# Patient Record
Sex: Female | Born: 1960
Health system: Southern US, Community
[De-identification: ages and names within clinical notes are randomized; demographics above are authoritative.]

## PROBLEM LIST (undated history)

## (undated) DIAGNOSIS — R5383 Other fatigue: Secondary | ICD-10-CM

## (undated) DIAGNOSIS — E785 Hyperlipidemia, unspecified: Secondary | ICD-10-CM

## (undated) HISTORY — DX: Hyperlipidemia, unspecified: E78.5

## (undated) HISTORY — DX: Other fatigue: R53.83

---

## 2016-07-26 DIAGNOSIS — H5203 Hypermetropia, bilateral: Secondary | ICD-10-CM | POA: Diagnosis not present

## 2016-08-05 DIAGNOSIS — Z86010 Personal history of colonic polyps: Secondary | ICD-10-CM | POA: Diagnosis not present

## 2016-09-02 DIAGNOSIS — J069 Acute upper respiratory infection, unspecified: Secondary | ICD-10-CM | POA: Diagnosis not present

## 2016-09-05 DIAGNOSIS — Z86010 Personal history of colonic polyps: Secondary | ICD-10-CM | POA: Diagnosis not present

## 2016-10-21 DIAGNOSIS — M2242 Chondromalacia patellae, left knee: Secondary | ICD-10-CM | POA: Diagnosis not present

## 2016-10-25 DIAGNOSIS — M2242 Chondromalacia patellae, left knee: Secondary | ICD-10-CM | POA: Diagnosis not present

## 2016-10-25 DIAGNOSIS — M25562 Pain in left knee: Secondary | ICD-10-CM | POA: Diagnosis not present

## 2016-10-27 DIAGNOSIS — M25562 Pain in left knee: Secondary | ICD-10-CM | POA: Diagnosis not present

## 2016-10-27 DIAGNOSIS — M2242 Chondromalacia patellae, left knee: Secondary | ICD-10-CM | POA: Diagnosis not present

## 2016-11-02 DIAGNOSIS — M2242 Chondromalacia patellae, left knee: Secondary | ICD-10-CM | POA: Diagnosis not present

## 2016-11-02 DIAGNOSIS — M25562 Pain in left knee: Secondary | ICD-10-CM | POA: Diagnosis not present

## 2017-01-18 DIAGNOSIS — Z01419 Encounter for gynecological examination (general) (routine) without abnormal findings: Secondary | ICD-10-CM | POA: Diagnosis not present

## 2017-01-18 DIAGNOSIS — Z6828 Body mass index (BMI) 28.0-28.9, adult: Secondary | ICD-10-CM | POA: Diagnosis not present

## 2017-02-13 DIAGNOSIS — D1801 Hemangioma of skin and subcutaneous tissue: Secondary | ICD-10-CM | POA: Diagnosis not present

## 2017-02-13 DIAGNOSIS — L570 Actinic keratosis: Secondary | ICD-10-CM | POA: Diagnosis not present

## 2017-02-13 DIAGNOSIS — D225 Melanocytic nevi of trunk: Secondary | ICD-10-CM | POA: Diagnosis not present

## 2017-02-13 DIAGNOSIS — L813 Cafe au lait spots: Secondary | ICD-10-CM | POA: Diagnosis not present

## 2017-02-13 DIAGNOSIS — L821 Other seborrheic keratosis: Secondary | ICD-10-CM | POA: Diagnosis not present

## 2017-03-13 DIAGNOSIS — N898 Other specified noninflammatory disorders of vagina: Secondary | ICD-10-CM | POA: Diagnosis not present

## 2017-03-13 DIAGNOSIS — Z1159 Encounter for screening for other viral diseases: Secondary | ICD-10-CM | POA: Diagnosis not present

## 2017-04-05 DIAGNOSIS — Z1329 Encounter for screening for other suspected endocrine disorder: Secondary | ICD-10-CM | POA: Diagnosis not present

## 2017-04-05 DIAGNOSIS — Z1231 Encounter for screening mammogram for malignant neoplasm of breast: Secondary | ICD-10-CM | POA: Diagnosis not present

## 2017-04-05 DIAGNOSIS — Z13220 Encounter for screening for lipoid disorders: Secondary | ICD-10-CM | POA: Diagnosis not present

## 2017-04-20 DIAGNOSIS — B009 Herpesviral infection, unspecified: Secondary | ICD-10-CM | POA: Diagnosis not present

## 2017-05-29 DIAGNOSIS — Z23 Encounter for immunization: Secondary | ICD-10-CM | POA: Diagnosis not present

## 2017-05-29 DIAGNOSIS — E7849 Other hyperlipidemia: Secondary | ICD-10-CM | POA: Diagnosis not present

## 2017-05-29 DIAGNOSIS — Z6828 Body mass index (BMI) 28.0-28.9, adult: Secondary | ICD-10-CM | POA: Diagnosis not present

## 2018-01-29 ENCOUNTER — Emergency Department (HOSPITAL_COMMUNITY)
Admission: EM | Admit: 2018-01-29 | Discharge: 2018-01-30 | Disposition: A | Discharge status: Home / Self Care | Payer: BLUE CROSS/BLUE SHIELD | Attending: Emergency Medicine | Admitting: Emergency Medicine

## 2018-01-29 ENCOUNTER — Encounter (HOSPITAL_COMMUNITY): Payer: Self-pay

## 2018-01-29 DIAGNOSIS — Y929 Unspecified place or not applicable: Secondary | ICD-10-CM | POA: Diagnosis not present

## 2018-01-29 DIAGNOSIS — Y999 Unspecified external cause status: Secondary | ICD-10-CM | POA: Diagnosis not present

## 2018-01-29 DIAGNOSIS — S60131A Contusion of right middle finger with damage to nail, initial encounter: Secondary | ICD-10-CM | POA: Insufficient documentation

## 2018-01-29 DIAGNOSIS — W230XXA Caught, crushed, jammed, or pinched between moving objects, initial encounter: Secondary | ICD-10-CM | POA: Diagnosis not present

## 2018-01-29 DIAGNOSIS — S6991XA Unspecified injury of right wrist, hand and finger(s), initial encounter: Secondary | ICD-10-CM | POA: Diagnosis present

## 2018-01-29 DIAGNOSIS — Y9389 Activity, other specified: Secondary | ICD-10-CM | POA: Insufficient documentation

## 2018-01-29 DIAGNOSIS — S61312A Laceration without foreign body of right middle finger with damage to nail, initial encounter: Secondary | ICD-10-CM | POA: Diagnosis not present

## 2018-01-29 DIAGNOSIS — Z23 Encounter for immunization: Secondary | ICD-10-CM | POA: Insufficient documentation

## 2018-01-29 DIAGNOSIS — S61212A Laceration without foreign body of right middle finger without damage to nail, initial encounter: Secondary | ICD-10-CM

## 2018-01-29 DIAGNOSIS — S6010XA Contusion of unspecified finger with damage to nail, initial encounter: Secondary | ICD-10-CM

## 2018-01-29 MED ORDER — LIDOCAINE HCL (PF) 1 % IJ SOLN
30.0000 mL | Freq: Once | INTRAMUSCULAR | Status: DC
  Filled 2018-01-29: qty 30

## 2018-01-29 NOTE — ED Provider Notes (Signed)
[facility]-EMERGENCY DEPT Provider Note   CSN: [id number] Arrival date & time: 01/29/18  2004     History   Chief Complaint Chief Complaint  Patient presents with  . finger laceration    HPI Sara Savage is a 57 y.o. female with a hx of major medical problems presents to the Emergency Department complaining of acute, persistent, right middle finger laceration onset proximal 1 6 PM tonight.  Patient reports she accidentally shut this finger in the car door.  She reports associated throbbing pain and fingernail turning black.  Patient makes the symptoms worse.  No alleviating factors.  She denies numbness, tingling, weakness.  Patient does not take blood thinners.   The history is provided by the patient and medical records. No language interpreter was used.    History reviewed. No pertinent past medical history.  There are no active problems to display for this patient.   History reviewed. No pertinent surgical history.   OB History   None      Home Medications    Prior to Admission medications   Not on File    Family History History reviewed. No pertinent family history.  Social History Social History   Tobacco Use  . Smoking status: Never Smoker  . Smokeless tobacco: Never Used  Substance Use Topics  . Alcohol use: Never    Frequency: Never  . Drug use: Never     Allergies   Patient has no allergy information on record.   Review of Systems Review of Systems  Constitutional: Negative for fever.  Gastrointestinal: Negative for nausea and vomiting.  Skin: Positive for wound.  Allergic/Immunologic: Negative for immunocompromised state.  Neurological: Negative for weakness and numbness.  Hematological: Does not bruise/bleed easily.  Psychiatric/Behavioral: The patient is not nervous/anxious.      Physical Exam Updated Vital Signs BP 130/68 (BP Location: Left Arm)   Pulse (!) 42   Temp 98.4 F (36.9 C) (Oral)   Resp 16    SpO2 100%   Physical Exam  Constitutional: She is oriented to person, place, and time. She appears well-developed and well-nourished. No distress.  HENT:  Head: Normocephalic and atraumatic.  Eyes: Conjunctivae are normal. No scleral icterus.  Neck: Normal range of motion.  Cardiovascular: Normal rate, regular rhythm, normal heart sounds and intact distal pulses.  No murmur heard. Capillary refill < 3 sec  Pulmonary/Chest: Effort normal and breath sounds normal. No respiratory distress.  Musculoskeletal: Normal range of motion. She exhibits no edema.  Full range of motion of the DIP, PIP and MCP of the right middle finger.  Large subungual hematoma underneath the fingernail.  No damage to the nail itself or to the cuticle.  3 cm laceration to the pad of the finger.  Sensation intact throughout the entire finger.  Strength 5/5 with flexion and extension of the right middle finger.  Neurological: She is alert and oriented to person, place, and time.  Skin: Skin is warm and dry. She is not diaphoretic.  Psychiatric: She has a normal mood and affect.  Nursing note and vitals reviewed.    ED Treatments / Results   Radiology Dg Finger Middle Right  Result Date: 01/30/2018 CLINICAL DATA:  Right middle finger pain after injury. Shut finger in car door. Crush injury with distal laceration. EXAM: RIGHT MIDDLE FINGER 2+V COMPARISON:  None. FINDINGS: There is no evidence of fracture or dislocation. There is no evidence of arthropathy or other focal bone abnormality. Soft tissues are unremarkable.  Dressing overlies the distal digit. Small well corticated density adjacent to the second digit middle phalanx appears chronic. IMPRESSION: Negative radiographs of the right middle finger. Electronically Signed   By: Rubye OaksMelanie  Ehinger M.D.   On: 01/30/2018 02:51    Procedures .Marland Kitchen.Laceration Repair Date/Time: 01/30/2018 2:44 AM Performed by: Dierdre ForthMuthersbaugh, Kimberlye Dilger, PA-C Authorized by: Dierdre ForthMuthersbaugh, Kiasia Chou,  PA-C   Consent:    Consent obtained:  Verbal   Consent given by:  Patient   Risks discussed:  Infection and pain   Alternatives discussed:  No treatment Anesthesia (see MAR for exact dosages):    Anesthesia method:  Nerve block   Block anesthetic:  Lidocaine 1% w/o epi (5mL)   Block injection procedure:  Anatomic landmarks identified, anatomic landmarks palpated, introduced needle, negative aspiration for blood and incremental injection   Block outcome:  Anesthesia achieved Laceration details:    Location:  Finger   Finger location:  R long finger   Length (cm):  2.7 Repair type:    Repair type:  Simple Pre-procedure details:    Preparation:  Patient was prepped and draped in usual sterile fashion Exploration:    Hemostasis achieved with:  Direct pressure   Wound exploration: wound explored through full range of motion and entire depth of wound probed and visualized   Treatment:    Area cleansed with:  Saline   Amount of cleaning:  Standard   Irrigation solution:  Sterile water   Irrigation volume:  500   Irrigation method:  Syringe Skin repair:    Repair method:  Sutures   Suture size:  4-0   Suture material:  Fast-absorbing gut   Suture technique:  Simple interrupted   Number of sutures:  6 Approximation:    Approximation:  Close Post-procedure details:    Dressing:  Non-adherent dressing   Patient tolerance of procedure:  Tolerated well, no immediate complications  .Nail Removal Date/Time: 01/30/2018 3:18 AM Performed by: Dierdre ForthMuthersbaugh, Sherlene Rickel, PA-C Authorized by: Dierdre ForthMuthersbaugh, Barbar Brede, PA-C   Consent:    Consent obtained:  Verbal   Consent given by:  Patient   Risks discussed:  Pain and permanent nail deformity   Alternatives discussed:  No treatment Location:    Hand:  R long finger Pre-procedure details:    Skin preparation:  ChloraPrep Anesthesia (see MAR for exact dosages):    Anesthesia method:  Nerve block   Block needle gauge:  25 G   Block  anesthetic:  Lidocaine 1% w/o epi   Block injection procedure:  Anatomic landmarks identified, anatomic landmarks palpated, introduced needle, negative aspiration for blood and incremental injection   Block outcome:  Anesthesia achieved Trephination:    Subungual hematoma drained: yes     Trephination instrument:  Cautery Post-procedure details:    Dressing:  4x4 sterile gauze   Patient tolerance of procedure:  Tolerated well, no immediate complications   (including critical care time)  Medications Ordered in ED Medications  lidocaine (PF) (XYLOCAINE) 1 % injection 30 mL (has no administration in time range)  Tdap (BOOSTRIX) injection 0.5 mL (0.5 mLs Intramuscular Given 01/30/18 0212)     Initial Impression / Assessment and Plan / ED Course  I have reviewed the triage vital signs and the nursing notes.  Pertinent labs & imaging results that were available during my care of the patient were reviewed by me and considered in my medical decision making (see chart for details).     Pressure irrigation performed. Wound explored and base of wound visualized in a bloodless  field without evidence of foreign body.  Laceration occurred < 8 hours prior to repair which was well tolerated.  Subungual hematoma drained.  Tdap updated.  Pt has no comorbidities to effect normal wound healing. Pt discharged without antibiotics.  X-ray without evidence of fracture or dislocation.  I personally evaluated these images.  Discussed suture home care with patient and answered questions. Pt to follow-up for wound check in 7 days; they are to return to the ED sooner for signs of infection. Pt is hemodynamically stable with no complaints prior to dc.   Final Clinical Impressions(s) / ED Diagnoses   Final diagnoses:  Subungual hematoma of digit of hand, initial encounter  Laceration of right middle finger without foreign body without damage to nail, initial encounter    ED Discharge Orders    None         Mardene Sayer Boyd Kerbs 01/30/18 0320    Shon Baton, MD 02/01/18 1710

## 2018-01-30 ENCOUNTER — Emergency Department (HOSPITAL_COMMUNITY): Payer: BLUE CROSS/BLUE SHIELD

## 2018-01-30 DIAGNOSIS — S61212A Laceration without foreign body of right middle finger without damage to nail, initial encounter: Secondary | ICD-10-CM | POA: Diagnosis not present

## 2018-01-30 MED ORDER — TETANUS-DIPHTH-ACELL PERTUSSIS 5-2.5-18.5 LF-MCG/0.5 IM SUSP
0.5000 mL | Freq: Once | INTRAMUSCULAR | Status: AC
  Administered 2018-01-30: 0.5 mL via INTRAMUSCULAR
  Filled 2018-01-30: qty 0.5

## 2018-01-30 NOTE — ED Notes (Signed)
Discharge instructions reviewed with pt. Pt verbalized understanding. Pt to follow up with PCP. PT ambulatory to waiting room.  

## 2018-01-30 NOTE — Discharge Instructions (Addendum)
1. Medications: Tylenol or ibuprofen for pain, usual home medications 2. Treatment: ice for swelling, keep wound clean with warm soap and water and keep bandage dry, do not submerge in water for 24 hours 3. Follow Up: Stitches will come out in 7 days.  Please return to the emergency department sooner if you have concerns. Return to the emergency department for increased redness, drainage of pus from the wound   WOUND CARE  Keep area clean and dry for 24 hours. Do not remove bandage, if applied.  After 24 hours, remove bandage and wash wound gently with mild soap and warm water. Reapply a new bandage after cleaning wound, if directed.   Continue daily cleansing with soap and water until stitches/staples are removed.  Do not apply any ointments or creams to the wound while stitches/staples are in place, as this may cause delayed healing. Return if you experience any of the following signs of infection: Swelling, redness, pus drainage, streaking, fever >101.0 F  Return if you experience excessive bleeding that does not stop after 15-20 minutes of constant, firm pressure.

## 2018-02-15 DIAGNOSIS — L821 Other seborrheic keratosis: Secondary | ICD-10-CM | POA: Diagnosis not present

## 2018-02-15 DIAGNOSIS — D225 Melanocytic nevi of trunk: Secondary | ICD-10-CM | POA: Diagnosis not present

## 2018-02-15 DIAGNOSIS — D1801 Hemangioma of skin and subcutaneous tissue: Secondary | ICD-10-CM | POA: Diagnosis not present

## 2018-02-15 DIAGNOSIS — L814 Other melanin hyperpigmentation: Secondary | ICD-10-CM | POA: Diagnosis not present

## 2018-03-19 DIAGNOSIS — E7849 Other hyperlipidemia: Secondary | ICD-10-CM | POA: Diagnosis not present

## 2018-03-19 DIAGNOSIS — R400 Somnolence: Secondary | ICD-10-CM | POA: Diagnosis not present

## 2018-03-19 DIAGNOSIS — R002 Palpitations: Secondary | ICD-10-CM | POA: Diagnosis not present

## 2018-03-19 DIAGNOSIS — R001 Bradycardia, unspecified: Secondary | ICD-10-CM | POA: Diagnosis not present

## 2018-03-19 DIAGNOSIS — R5383 Other fatigue: Secondary | ICD-10-CM | POA: Diagnosis not present

## 2018-04-06 DIAGNOSIS — Z01419 Encounter for gynecological examination (general) (routine) without abnormal findings: Secondary | ICD-10-CM | POA: Diagnosis not present

## 2018-04-06 DIAGNOSIS — Z6828 Body mass index (BMI) 28.0-28.9, adult: Secondary | ICD-10-CM | POA: Diagnosis not present

## 2018-05-09 ENCOUNTER — Encounter: Payer: Self-pay | Admitting: Neurology

## 2018-05-10 ENCOUNTER — Encounter: Payer: Self-pay | Admitting: Neurology

## 2018-05-10 ENCOUNTER — Ambulatory Visit (INDEPENDENT_AMBULATORY_CARE_PROVIDER_SITE_OTHER): Payer: BLUE CROSS/BLUE SHIELD | Admitting: Neurology

## 2018-05-10 VITALS — BP 118/78 | HR 56 | Ht 65.5 in | Wt 172.0 lb

## 2018-05-10 DIAGNOSIS — R0683 Snoring: Secondary | ICD-10-CM

## 2018-05-10 DIAGNOSIS — R001 Bradycardia, unspecified: Secondary | ICD-10-CM

## 2018-05-10 DIAGNOSIS — R5383 Other fatigue: Secondary | ICD-10-CM

## 2018-05-10 DIAGNOSIS — R002 Palpitations: Secondary | ICD-10-CM | POA: Diagnosis not present

## 2018-05-10 NOTE — Progress Notes (Signed)
SLEEP MEDICINE CLINIC   Provider:  Melvyn Novas, M.D.   Primary Care Physician:  Alysia Penna, MD   Referring Provider: NP Ronney Lion   Chief Complaint  Patient presents with  . New Patient (Initial Visit)    pt alone, rm 11. pt states that there is a heart history in the family. she has been diagnoses with asymptomatic bradycardia. she wakes up with flutters. pt complains of being tired more than usual.     HPI:  Sara Savage is a 57 y.o. female of [place]- [place] ancestry, seen here on 05-10-2018  in a referral from Dr. Link Snuffer / S. Edwards, NP for a sleep evaluation, feeling tired a lot.  She is an athletes heart with asymptomatic bradycardia, is a Armed forces operational officer, Presenter, broadcasting , Master's in physical education and sports science.   Chief complaint according to patient : " I have a reduced energy level" -" trouble to get going in AM", " I am sleeping well but I snore " Several labs were ordered, results not available to me -No restless legs, but a Vit D deficiency was noted. She reduced carb intake.    Sleep habits are as follows: Dinnertime is between 6 and 8 PM , dependent on Tennis match times. She is typically in Bed by 10.30 PM , reading- and no trouble to fall asleep far before midnight. The bedroom is cool, quiet and dark. Her sleep is sometimes interrupted by her retired husband, who is ' tethered " to his electronic devices.  He noted her snoring when in supine, she usually prefers to sleep on her right side. Non adjustable bed, sleeps on one pillow only.   1-2 times nocturia, the first at 3 AM. Goes back to sleep. She is not sure if she dreams at all.  Her alarm is set 7.15, works start at 8.30. She hits snooze several times.   Her sleep may not be as restorative and refreshing it is used to be, but as soon as she is active and on the go she feels ready to face her day there is no excessive daytime sleepiness, she wakes up without any discomfort,  diaphoresis, dizziness, nausea or headaches.  She is woken by palpitations, though !  Sleep medical history and family sleep history:  Father had early cardiac problems with arrhythmia- lived to be 13, dies of CHF (with defibrillator !).  Paternal grandfather died of MI, was a smoker, drinker. Mother died at 60.   Social history: married, Engineer, manufacturing systems - professional. Non smoker, ETOH socially cocktail hour- caffeine - coffee in AM 2 cups. No sodas, no iced tea.;    Review of Systems: Out of a complete 14 system review, the patient complains of only the following symptoms, and all other reviewed systems are negative.  Palpitations. In AM - when she wakes up on her left side - and more often following a late dinner with wine.  Epworth Sleepiness Score- 4/ 24   , Fatigue severity score n/a   , depression score N/A    Social History   Socioeconomic History  . Marital status: Married    Spouse name: Not on file  . Number of children: Not on file  . Years of education: Not on file  . Highest education level: Not on file  Occupational History  . Not on file  Social Needs  . Financial resource strain: Not on file  . Food insecurity:    Worry: Not on file  Inability: Not on file  . Transportation needs:    Medical: Not on file    Non-medical: Not on file  Tobacco Use  . Smoking status: Never Smoker  . Smokeless tobacco: Never Used  Substance and Sexual Activity  . Alcohol use: Yes    Frequency: Never    Comment: socially  . Drug use: Never  . Sexual activity: Not on file  Lifestyle  . Physical activity:    Days per week: Not on file    Minutes per session: Not on file  . Stress: Not on file  Relationships  . Social connections:    Talks on phone: Not on file    Gets together: Not on file    Attends religious service: Not on file    Active member of club or organization: Not on file    Attends meetings of clubs or organizations: Not on file    Relationship status: Not on  file  . Intimate partner violence:    Fear of current or ex partner: Not on file    Emotionally abused: Not on file    Physically abused: Not on file    Forced sexual activity: Not on file  Other Topics Concern  . Not on file  Social History Narrative  . Not on file    Family History  Problem Relation Age of Onset  . Heart attack Father   . Heart failure Father   . Colon cancer Brother   . Cancer Paternal Grandfather     Past Medical History:  Diagnosis Date  . Fatigue   . HLD (hyperlipidemia)     Current Outpatient Medications  Medication Sig Dispense Refill  . rosuvastatin (CRESTOR) 5 MG tablet   5   No current facility-administered medications for this visit.     Allergies as of 05/10/2018  . (Not on File)    Vitals: BP 118/78   Pulse (!) 56   Ht 5' 5.5" (1.664 m)   Wt 172 lb (78 kg)   BMI 28.19 kg/m  Last Weight:  Wt Readings from Last 1 Encounters:  05/10/18 172 lb (78 kg)   GNF:AOZH mass index is 28.19 kg/m.     Last Height:   Ht Readings from Last 1 Encounters:  05/10/18 5' 5.5" (1.664 m)    Physical exam:  General: The patient is awake, alert and appears not in acute distress. The patient is well groomed. Head: Normocephalic, atraumatic. Neck is supple. Mallampati 2-3,  neck circumference:14.5 . Nasal airflow patent ,no TMJ click but she clenches.. Retrognathia is not seen.  Cardiovascular:  Regular rate and rhythm, without  murmurs or carotid bruit, and without distended neck veins. Respiratory: Lungs are clear to auscultation. Skin:  Without evidence of edema, or rash Trunk: BMI is 28.  The patient's posture is erect.   Neurologic exam : The patient is awake and alert, oriented to place and time.   Memory subjective described as intact.   Attention span & concentration ability appears normal.  Speech is fluent,  without dysarthria, dysphonia or aphasia.  Mood and affect are appropriate.  Cranial nerves: Pupils are equal and briskly  reactive to light.  Extraocular movements  in vertical and horizontal planes intact and without nystagmus. Visual fields by finger perimetry are intact.Hearing to finger rub intact.  Facial sensation intact to fine touch. Facial motor strength is symmetric, but the right eye shows a mild ptosis, straddling the upper edge of the pupil ( a family trait) .  and tongue and uvula move midline. Shoulder shrug was symmetrical.  Motor exam:  Normal tone, muscle bulk and symmetric strength in all extremities. Good grip strength.  Sensory:  Fine touch, pinprick and vibration were tested in all extremities. Proprioception tested in the upper extremities was normal.  Coordination: Rapid alternating movements in the fingers/hands was normal. Finger-to-nose maneuver  normal without evidence of ataxia, dysmetria or tremor. Gait and station: Patient walks without assistive device and is able unassisted to climb up to the exam table. Strength within normal limits. Stance is stable and normal. Turns with 3 Steps. Romberg testing is negative.  Deep tendon reflexes: in the  upper and lower extremities are symmetric and intact. Babinski maneuver response is  downgoing.    Assessment:  After physical and neurologic examination, review of laboratory studies,  Personal review of imaging studies, reports of other /same  Imaging studies, results of polysomnography and / or neurophysiology testing and pre-existing records as far as provided in visit., my assessment is   1) Feeling more fatigue - with snoring only, no witnessed apnea, no insomnia, not having frequent nocturia- averaging 7 hours of sleep. She has reported dreamless sleep- a sign that she may have lost REM sleep due to apnea clustering in that sleep stage.   2) Bradycardia is asymptomatic-and I attribute her tendency to have a heart rate of 50 or less at rest to her being an athlete.  3) Morning palpitations have been reported and may be better evaluated by a  cardiac monitor.   4) Ptosis is a strong french canadian trait.    However I will want to rule out that these are related to apnea, hypoxemia or upper airway resistance he.  I will ask for an attended sleep study to be permitted so that I cannot for sure look at REM sleep and its correlation.  Depending on her insurance I may have to order a home sleep test first and if I do not find the desired answers there switch to an attended sleep study next.  My goal is to have the testing completed before the month  ends.    The patient was advised of the nature of the diagnosed disorder , the treatment options and the  risks for general health and wellness arising from not treating the condition.   I spent more than 45 minutes of face to face time with the patient.  Greater than 50% of time was spent in counseling and coordination of care. We have discussed the diagnosis and differential and I answered the patient's questions.    Plan:  Treatment plan and additional workup :  Attended sleep study for evaluation of palpitation, bradycardia and fatigue.   Melvyn NovasARMEN Dante Roudebush, MD 05/10/2018, 8:53 AM  Certified in Neurology by ABPN Certified in Sleep Medicine by [facility]  [facility] [address], [address] [place], [place] [id number]

## 2018-05-17 ENCOUNTER — Other Ambulatory Visit: Payer: Self-pay | Admitting: Neurology

## 2018-05-17 ENCOUNTER — Telehealth: Payer: Self-pay

## 2018-05-17 DIAGNOSIS — R5383 Other fatigue: Secondary | ICD-10-CM

## 2018-05-17 DIAGNOSIS — R002 Palpitations: Secondary | ICD-10-CM

## 2018-05-17 DIAGNOSIS — R001 Bradycardia, unspecified: Secondary | ICD-10-CM

## 2018-05-17 DIAGNOSIS — R0683 Snoring: Secondary | ICD-10-CM

## 2018-05-17 NOTE — Telephone Encounter (Signed)
Insurance has denied in lab sleep study request. Do you want to order HST? 

## 2018-05-17 NOTE — Telephone Encounter (Signed)
Yes

## 2018-05-25 DIAGNOSIS — E7849 Other hyperlipidemia: Secondary | ICD-10-CM | POA: Diagnosis not present

## 2018-05-25 DIAGNOSIS — Z0000 Encounter for general adult medical examination without abnormal findings: Secondary | ICD-10-CM | POA: Diagnosis not present

## 2018-05-30 DIAGNOSIS — R001 Bradycardia, unspecified: Secondary | ICD-10-CM | POA: Diagnosis not present

## 2018-05-30 DIAGNOSIS — R0683 Snoring: Secondary | ICD-10-CM | POA: Diagnosis not present

## 2018-05-30 DIAGNOSIS — Z1389 Encounter for screening for other disorder: Secondary | ICD-10-CM | POA: Diagnosis not present

## 2018-05-30 DIAGNOSIS — Z0000 Encounter for general adult medical examination without abnormal findings: Secondary | ICD-10-CM | POA: Diagnosis not present

## 2018-05-30 DIAGNOSIS — E7849 Other hyperlipidemia: Secondary | ICD-10-CM | POA: Diagnosis not present

## 2018-05-30 DIAGNOSIS — Z23 Encounter for immunization: Secondary | ICD-10-CM | POA: Diagnosis not present

## 2018-06-05 DIAGNOSIS — Z1212 Encounter for screening for malignant neoplasm of rectum: Secondary | ICD-10-CM | POA: Diagnosis not present

## 2018-06-13 ENCOUNTER — Ambulatory Visit (INDEPENDENT_AMBULATORY_CARE_PROVIDER_SITE_OTHER): Payer: BLUE CROSS/BLUE SHIELD | Admitting: Neurology

## 2018-06-13 DIAGNOSIS — R5383 Other fatigue: Secondary | ICD-10-CM

## 2018-06-13 DIAGNOSIS — G4730 Sleep apnea, unspecified: Secondary | ICD-10-CM

## 2018-06-13 DIAGNOSIS — I495 Sick sinus syndrome: Secondary | ICD-10-CM

## 2018-06-13 DIAGNOSIS — R0683 Snoring: Secondary | ICD-10-CM

## 2018-06-13 DIAGNOSIS — G4733 Obstructive sleep apnea (adult) (pediatric): Secondary | ICD-10-CM | POA: Diagnosis not present

## 2018-06-13 DIAGNOSIS — R002 Palpitations: Secondary | ICD-10-CM

## 2018-06-13 DIAGNOSIS — R001 Bradycardia, unspecified: Secondary | ICD-10-CM

## 2018-06-20 NOTE — Addendum Note (Signed)
Addended by: Melvyn Novas on: 06/20/2018 06:14 PM   Modules accepted: Orders

## 2018-06-20 NOTE — Procedures (Signed)
NAME:  Sara Savage                                                                DOB: 06/23/1961 MEDICAL RECORD [id number]                                                DOS:  06/14/2018 REFERRING PHYSICIAN: Alysia Penna, MD STUDY PERFORMED: Home Sleep Study on apnea link HISTORY: HPI:  [name] is a 57 y.o. female and was seen on 05-10-2018 upon referral from Dr. Link Snuffer / S. Edwards, NP, for a sleep evaluation. She reports feeling tired a lot. She has had an athlete's heart with asymptomatic bradycardia, is a Armed forces operational officer, teaching professionally. She obtained a Master's in physical education and sports science.   Chief complaint according to patient:" I have a reduced energy level" -" trouble to get going in AM"," I am sleeping well- but I snore". Reports having 1-2 times nocturia, the first at 3 AM. Goes back to sleep. She is not sure if she dreams at all.  Her alarm is set 7.15, works start at 8.30. She hits snooze several times. Her sleep may not be as restorative and refreshing it is used to be, but as soon as she is active and on the go she feels ready to face her day there is no excessive daytime sleepiness, she wakes up without any discomfort, diaphoresis, dizziness, nausea or headaches.  Epworth Sleepiness Score- 4/ 24 points, BMI: 28.6  STUDY RESULTS:  Total Recording Time: 7 hours 28 minutes, valid test time was 4 h 0 min.  Total Apnea/Hypopnea Index (AHI):  7.5 /h, RDI: 11.2 /h Average Oxygen Saturation:   89 %,   Lowest Oxygen Saturation: 85 %  Total Time in Oxygen Saturation below 89 %: 90 minutes (I believe there must be an artefact)  Average Heart Rate:  56 bpm (between 43 and 125 bpm) - tachy bradycardia.  IMPRESSION: This HST suggested the presence of only mild sleep apnea, but at least moderate snoring. There was an oxygen desaturation index of 6.2/h established. The patient had tachy-brady cardia, remained in regular heart rhythm.  The prolonged hypoxemia remains  unexplained, and may reflect an artefact.  RECOMMENDATION: repeat an attended sleep study-  I certify that I have reviewed the raw data recording prior to the issuance of this report in accordance with the standards of the American Academy of Sleep Medicine (AASM). Melvyn Novas, M.D.   06-20-2018    Medical Director of [facility], accredited by the AASM. Diplomat of the ABPN and ABSM.

## 2018-06-21 ENCOUNTER — Telehealth: Payer: Self-pay | Admitting: *Deleted

## 2018-06-21 NOTE — Telephone Encounter (Signed)
Pt returning RN's call.

## 2018-06-21 NOTE — Telephone Encounter (Signed)
-----   Message from Melvyn Novas, MD sent at 06/20/2018  6:14 PM EDT ----- IMPRESSION: This HST suggested the presence of only mild sleep  apnea, but at least moderate snoring. There was an oxygen  desaturation index of 6.2/h established. The patient had  tachy-brady cardia, remained in regular heart rhythm.  The prolonged hypoxemia remains unexplained, and may reflect an  artefact.  RECOMMENDATION: repeat an attended sleep study- order placed today. 06-20-2018

## 2018-06-21 NOTE — Telephone Encounter (Signed)
LMTC./fim 

## 2018-06-21 NOTE — Telephone Encounter (Signed)
-----   Message from Carmen Dohmeier, MD sent at 06/20/2018  6:14 PM EDT ----- IMPRESSION: This HST suggested the presence of only mild sleep  apnea, but at least moderate snoring. There was an oxygen  desaturation index of 6.2/h established. The patient had  tachy-brady cardia, remained in regular heart rhythm.  The prolonged hypoxemia remains unexplained, and may reflect an  artefact.  RECOMMENDATION: repeat an attended sleep study- order placed today. 06-20-2018 

## 2018-06-21 NOTE — Telephone Encounter (Signed)
LMOM for Sara Savage with below HST results and Dr. Oliva Bustard rec. for an in-lab study.  Please call if she has questions/fim

## 2018-06-28 DIAGNOSIS — I495 Sick sinus syndrome: Secondary | ICD-10-CM | POA: Insufficient documentation

## 2018-06-28 NOTE — Progress Notes (Signed)
Cardiology Office Note:    Date:  06/29/2018   ID:  Sara Savage, DOB [birth year], MRN [id number]  PCP:  Alysia Penna, MD  Cardiologist:  No primary care provider on file.   Referring MD: Alysia Penna, MD   Chief Complaint  Patient presents with  . Advice Only    Bradycardia    History of Present Illness:    Sara Savage is a 57 y.o. female with a hx of palpitations, abnormal sleep study, and fatigue who is referred for consultation by Dr.Carmen Dohmeier / Remi Haggard, NP for bradycardia.  She is referred because of measured bradycardia on her wearable heart monitor device.  Also has history of an EKG at [facility] with heart rate of 42 bpm.  This led to evaluation with a sleep study.  There is questionable sleep apnea.  [name] is very active.  She plays tennis and exercises regularly.  She notices no limitations and no dramatic change in exertional tolerance.  She specifically denies excessive shortness of breath, chest pain, episodes of syncope, and tachycardia/prolonged palpitations.  Father had coronary disease first MI in his late 49s.  Eventual AICD.  He was a smoker.  5 siblings without any known vascular disease.  Hyperlipidemia being treated for primary prevention with low-dose Crestor 5 mg/day and most recent LDL of 111.  Past Medical History:  Diagnosis Date  . Fatigue   . HLD (hyperlipidemia)     History reviewed. No pertinent surgical history.  Current Medications: Current Meds  Medication Sig  . rosuvastatin (CRESTOR) 5 MG tablet      Allergies:   Patient has no allergy information on record.   Social History   Socioeconomic History  . Marital status: Married    Spouse name: Not on file  . Number of children: Not on file  . Years of education: Not on file  . Highest education level: Not on file  Occupational History  . Not on file  Social Needs  . Financial resource strain: Not on file  . Food insecurity:     Worry: Not on file    Inability: Not on file  . Transportation needs:    Medical: Not on file    Non-medical: Not on file  Tobacco Use  . Smoking status: Never Smoker  . Smokeless tobacco: Never Used  Substance and Sexual Activity  . Alcohol use: Yes    Frequency: Never    Comment: socially  . Drug use: Never  . Sexual activity: Not on file  Lifestyle  . Physical activity:    Days per week: Not on file    Minutes per session: Not on file  . Stress: Not on file  Relationships  . Social connections:    Talks on phone: Not on file    Gets together: Not on file    Attends religious service: Not on file    Active member of club or organization: Not on file    Attends meetings of clubs or organizations: Not on file    Relationship status: Not on file  Other Topics Concern  . Not on file  Social History Narrative  . Not on file     Family History: The patient's family history includes Cancer in her paternal grandfather; Colon cancer in her brother; Heart attack in her father; Heart failure in her father.  ROS:   Please see the history of present illness.    Concern about bradycardia but otherwise no complaints.  He does  snore.  She has some fatigue.  Feels better now after starting vitamin D.  All other systems reviewed and are negative.  EKGs/Labs/Other Studies Reviewed:    The following studies were reviewed today: Recent home sleep study was equivocal with reference to the diagnosis of sleep apnea.  EKG:  EKG is  ordered today.  The ekg ordered today demonstrates sinus bradycardia at 57 bpm with left axis deviation/left anterior hemiblock and poor R wave progression V1 through V3.  Recent Labs: No results found for requested labs within last 8760 hours.  Recent Lipid Panel No results found for: CHOL, TRIG, HDL, CHOLHDL, VLDL, LDLCALC, LDLDIRECT  Physical Exam:    VS:  BP 104/72   Pulse (!) 57   Ht 5' 5.5" (1.664 m)   Wt 174 lb 9.6 oz (79.2 kg)   SpO2 96%    BMI 28.61 kg/m     Wt Readings from Last 3 Encounters:  06/29/18 174 lb 9.6 oz (79.2 kg)  05/10/18 172 lb (78 kg)     GEN:  Well nourished, well developed in no acute distress HEENT: Normal NECK: No JVD. LYMPHATICS: No lymphadenopathy CARDIAC: RRR, no murmur, no gallop, no edema. VASCULAR: 2+ symmetric radial, carotids, and posterior tibial pulses.  No bruits. RESPIRATORY:  Clear to auscultation without rales, wheezing or rhonchi  ABDOMEN: Soft, non-tender, non-distended, No pulsatile mass, MUSCULOSKELETAL: No deformity  SKIN: Warm and dry NEUROLOGIC:  Alert and oriented x 3 PSYCHIATRIC:  Normal affect   ASSESSMENT:    1. Bradycardia   2. Hyperlipidemia LDL goal <100   3. Family history of early CAD   4. EKG abnormality    PLAN:    In order of problems listed above:  1. Probably physiologic.  Rule out asymptomatic sinus node dysfunction.  24-hour Holter monitor will be performed.  No action is required at this time. 2. Being treated as primary prevention with low-dose Crestor 5 mg/day.  Most recent LDL since starting therapy was 111.  If she is going to take statin therapy I would recommend increasing intensity to get LDL at least under 100.  This would probably happen if she increases the dose to 10 mg/day. 3. No significant risk factors other than hyperlipidemia.  She is getting greater than 150 minutes of moderate aerobic activity per week.  We discussed glycemic surveillance, blood pressure 130/80 or less, LDL cholesterol less than 098, smoking cessation/abstinence (never a smoker), and management of sleep apnea if present. 4. Left anterior hemiblock of no clinical significance.  Clinical follow-up as needed unless surprises from the Holter monitor.   Medication Adjustments/Labs and Tests Ordered: Current medicines are reviewed at length with the patient today.  Concerns regarding medicines are outlined above.  Orders Placed This Encounter  Procedures  . Holter monitor  - 24 hour  . EKG 12-Lead   No orders of the defined types were placed in this encounter.   Patient Instructions  Medication Instructions:  Your physician recommends that you continue on your current medications as directed. Please refer to the Current Medication list given to you today.  If you need a refill on your cardiac medications before your next appointment, please call your pharmacy.   Lab work: None If you have labs (blood work) drawn today and your tests are completely normal, you will receive your results only by: Marland Kitchen MyChart Message (if you have MyChart) OR . A paper copy in the mail If you have any lab test that is abnormal or we  need to change your treatment, we will call you to review the results.  Testing/Procedures: Your physician has recommended that you wear a 24 hour holter monitor. Holter monitors are medical devices that record the heart's electrical activity. Doctors most often use these monitors to diagnose arrhythmias. Arrhythmias are problems with the speed or rhythm of the heartbeat. The monitor is a small, portable device. You can wear one while you do your normal daily activities. This is usually used to diagnose what is causing palpitations/syncope (passing out).   Follow-Up: Your physician recommends that you schedule a follow-up appointment as needed with Dr. Katrinka Blazing.    Any Other Special Instructions Will Be Listed Below (If Applicable)      Signed, Lesleigh Noe, MD  06/29/2018 9:22 AM    [facility]

## 2018-06-29 ENCOUNTER — Ambulatory Visit (INDEPENDENT_AMBULATORY_CARE_PROVIDER_SITE_OTHER): Payer: BLUE CROSS/BLUE SHIELD | Admitting: Interventional Cardiology

## 2018-06-29 ENCOUNTER — Ambulatory Visit: Payer: BLUE CROSS/BLUE SHIELD | Admitting: Interventional Cardiology

## 2018-06-29 ENCOUNTER — Encounter: Payer: Self-pay | Admitting: Interventional Cardiology

## 2018-06-29 VITALS — BP 104/72 | HR 57 | Ht 65.5 in | Wt 174.6 lb

## 2018-06-29 DIAGNOSIS — I444 Left anterior fascicular block: Secondary | ICD-10-CM

## 2018-06-29 DIAGNOSIS — R9431 Abnormal electrocardiogram [ECG] [EKG]: Secondary | ICD-10-CM

## 2018-06-29 DIAGNOSIS — Z8249 Family history of ischemic heart disease and other diseases of the circulatory system: Secondary | ICD-10-CM | POA: Diagnosis not present

## 2018-06-29 DIAGNOSIS — R001 Bradycardia, unspecified: Secondary | ICD-10-CM | POA: Diagnosis not present

## 2018-06-29 DIAGNOSIS — E785 Hyperlipidemia, unspecified: Secondary | ICD-10-CM | POA: Diagnosis not present

## 2018-06-29 NOTE — Patient Instructions (Signed)
Medication Instructions:  Your physician recommends that you continue on your current medications as directed. Please refer to the Current Medication list given to you today.  If you need a refill on your cardiac medications before your next appointment, please call your pharmacy.   Lab work: None If you have labs (blood work) drawn today and your tests are completely normal, you will receive your results only by: Marland Kitchen MyChart Message (if you have MyChart) OR . A paper copy in the mail If you have any lab test that is abnormal or we need to change your treatment, we will call you to review the results.  Testing/Procedures: Your physician has recommended that you wear a 24 hour holter monitor. Holter monitors are medical devices that record the heart's electrical activity. Doctors most often use these monitors to diagnose arrhythmias. Arrhythmias are problems with the speed or rhythm of the heartbeat. The monitor is a small, portable device. You can wear one while you do your normal daily activities. This is usually used to diagnose what is causing palpitations/syncope (passing out).   Follow-Up: Your physician recommends that you schedule a follow-up appointment as needed with Dr. Katrinka Blazing.    Any Other Special Instructions Will Be Listed Below (If Applicable)

## 2018-07-04 DIAGNOSIS — N952 Postmenopausal atrophic vaginitis: Secondary | ICD-10-CM | POA: Diagnosis not present

## 2018-07-04 DIAGNOSIS — B009 Herpesviral infection, unspecified: Secondary | ICD-10-CM | POA: Diagnosis not present

## 2018-07-04 DIAGNOSIS — Z1231 Encounter for screening mammogram for malignant neoplasm of breast: Secondary | ICD-10-CM | POA: Diagnosis not present

## 2018-07-12 ENCOUNTER — Ambulatory Visit (INDEPENDENT_AMBULATORY_CARE_PROVIDER_SITE_OTHER): Payer: BLUE CROSS/BLUE SHIELD

## 2018-07-12 DIAGNOSIS — R001 Bradycardia, unspecified: Secondary | ICD-10-CM

## 2018-10-10 DIAGNOSIS — H5213 Myopia, bilateral: Secondary | ICD-10-CM | POA: Diagnosis not present

## 2019-02-13 DIAGNOSIS — L821 Other seborrheic keratosis: Secondary | ICD-10-CM | POA: Diagnosis not present

## 2019-02-13 DIAGNOSIS — D225 Melanocytic nevi of trunk: Secondary | ICD-10-CM | POA: Diagnosis not present

## 2019-02-13 DIAGNOSIS — L814 Other melanin hyperpigmentation: Secondary | ICD-10-CM | POA: Diagnosis not present

## 2019-02-13 DIAGNOSIS — D1801 Hemangioma of skin and subcutaneous tissue: Secondary | ICD-10-CM | POA: Diagnosis not present

## 2019-05-14 DIAGNOSIS — B009 Herpesviral infection, unspecified: Secondary | ICD-10-CM | POA: Diagnosis not present

## 2019-05-14 DIAGNOSIS — Z01419 Encounter for gynecological examination (general) (routine) without abnormal findings: Secondary | ICD-10-CM | POA: Diagnosis not present

## 2019-05-14 DIAGNOSIS — Z6829 Body mass index (BMI) 29.0-29.9, adult: Secondary | ICD-10-CM | POA: Diagnosis not present

## 2019-05-23 DIAGNOSIS — Z23 Encounter for immunization: Secondary | ICD-10-CM | POA: Diagnosis not present

## 2019-06-04 DIAGNOSIS — R5383 Other fatigue: Secondary | ICD-10-CM | POA: Diagnosis not present

## 2019-06-04 DIAGNOSIS — E7849 Other hyperlipidemia: Secondary | ICD-10-CM | POA: Diagnosis not present

## 2019-06-04 DIAGNOSIS — Z0000 Encounter for general adult medical examination without abnormal findings: Secondary | ICD-10-CM | POA: Diagnosis not present

## 2019-06-07 DIAGNOSIS — R82998 Other abnormal findings in urine: Secondary | ICD-10-CM | POA: Diagnosis not present

## 2019-06-11 DIAGNOSIS — Z1212 Encounter for screening for malignant neoplasm of rectum: Secondary | ICD-10-CM | POA: Diagnosis not present

## 2019-06-11 DIAGNOSIS — Z0000 Encounter for general adult medical examination without abnormal findings: Secondary | ICD-10-CM | POA: Diagnosis not present

## 2019-06-11 DIAGNOSIS — Z1331 Encounter for screening for depression: Secondary | ICD-10-CM | POA: Diagnosis not present

## 2019-07-09 DIAGNOSIS — Z1231 Encounter for screening mammogram for malignant neoplasm of breast: Secondary | ICD-10-CM | POA: Diagnosis not present

## 2019-07-09 DIAGNOSIS — Z13820 Encounter for screening for osteoporosis: Secondary | ICD-10-CM | POA: Diagnosis not present

## 2019-07-17 DIAGNOSIS — Z03818 Encounter for observation for suspected exposure to other biological agents ruled out: Secondary | ICD-10-CM | POA: Diagnosis not present

## 2020-01-02 DIAGNOSIS — E7849 Other hyperlipidemia: Secondary | ICD-10-CM | POA: Diagnosis not present

## 2020-02-19 DIAGNOSIS — D2261 Melanocytic nevi of right upper limb, including shoulder: Secondary | ICD-10-CM | POA: Diagnosis not present

## 2020-02-19 DIAGNOSIS — D2262 Melanocytic nevi of left upper limb, including shoulder: Secondary | ICD-10-CM | POA: Diagnosis not present

## 2020-02-19 DIAGNOSIS — D225 Melanocytic nevi of trunk: Secondary | ICD-10-CM | POA: Diagnosis not present

## 2020-02-19 DIAGNOSIS — L821 Other seborrheic keratosis: Secondary | ICD-10-CM | POA: Diagnosis not present

## 2020-03-18 DIAGNOSIS — Z0100 Encounter for examination of eyes and vision without abnormal findings: Secondary | ICD-10-CM | POA: Diagnosis not present

## 2020-03-18 DIAGNOSIS — H5213 Myopia, bilateral: Secondary | ICD-10-CM | POA: Diagnosis not present

## 2020-04-21 IMAGING — CR DG FINGER MIDDLE 2+V*R*
3 series · 3 of 3 positions shown · non-contrast
Comparison: None.

CLINICAL DATA: Right middle finger pain after injury. Shut finger
in car door. Crush injury with distal laceration.

EXAM:
RIGHT MIDDLE FINGER 2+V

[x finger pa right]
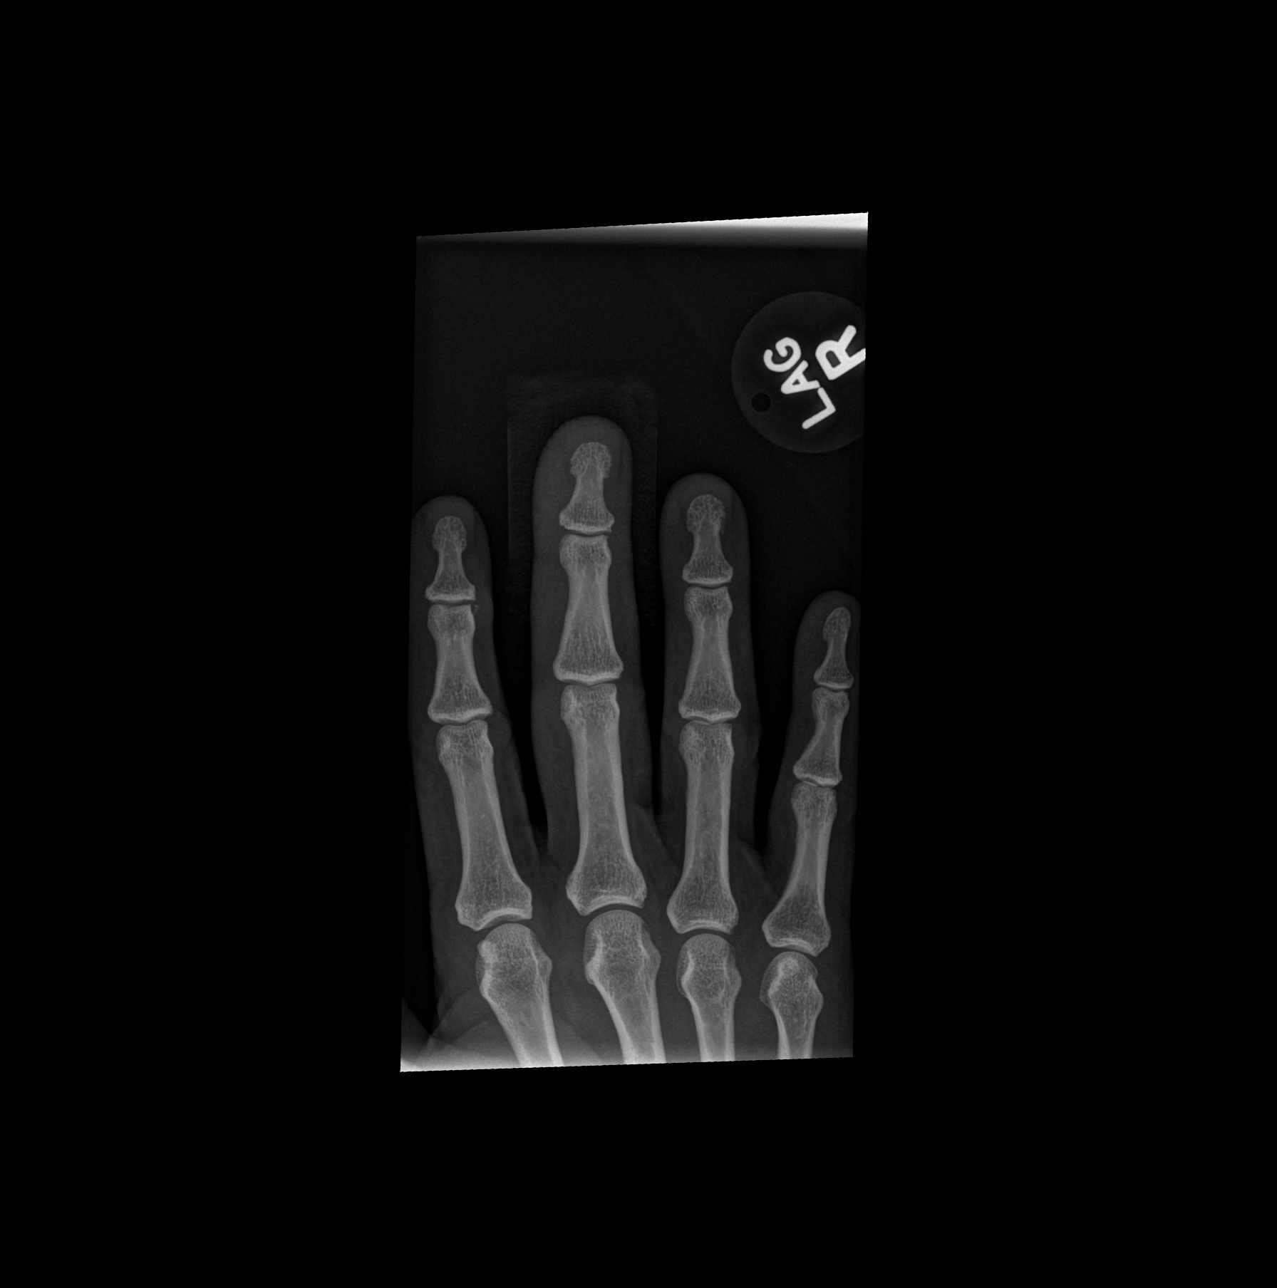

[x finger obl right]
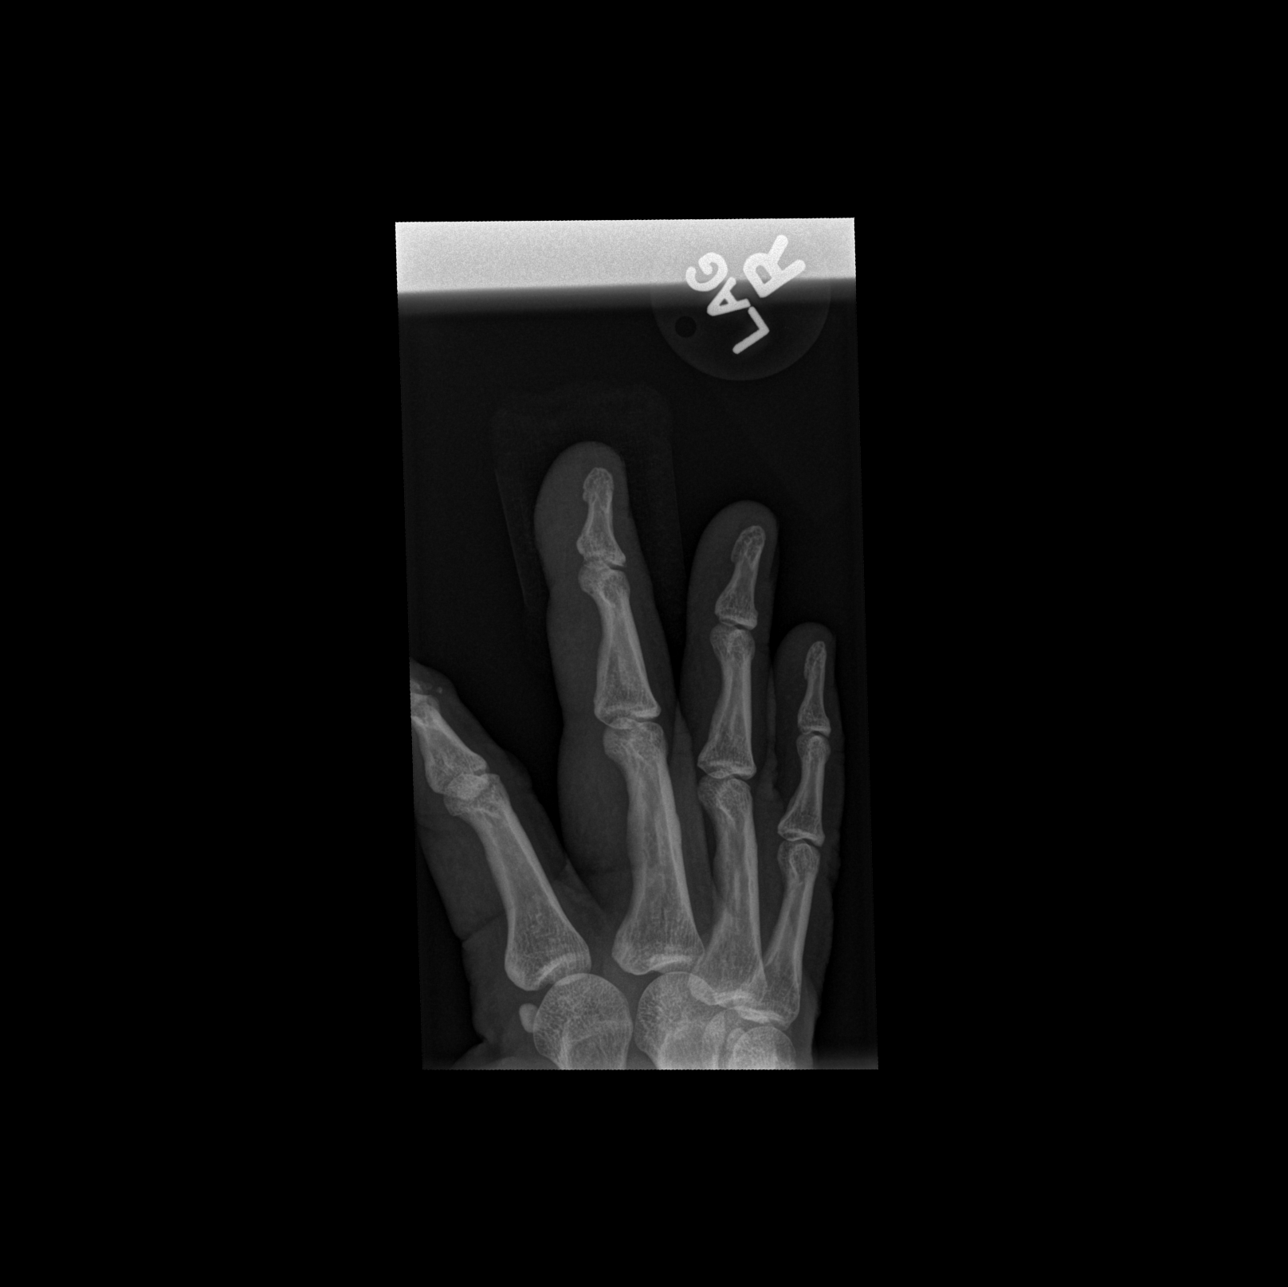

[x finger lat right]
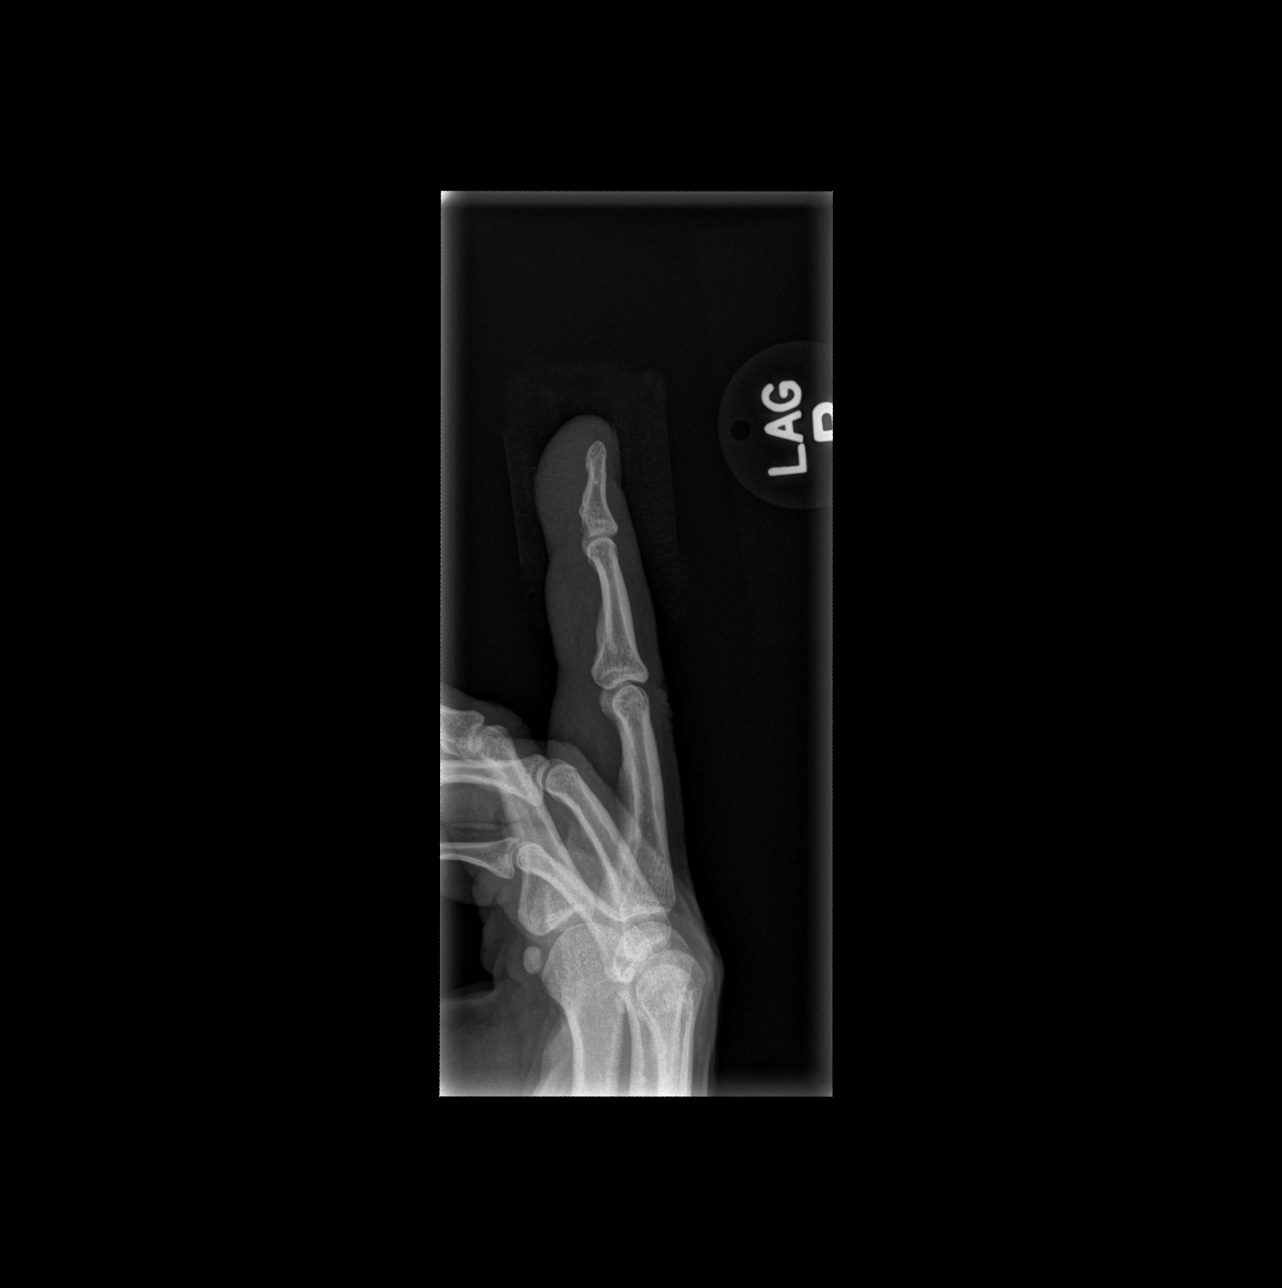

[3 of 3 positions shown; findings below may reference images not displayed]

FINDINGS: There is no evidence of fracture or dislocation. There is no
evidence of arthropathy or other focal bone abnormality. Soft
tissues are unremarkable. Dressing overlies the distal digit. Small
well corticated density adjacent to the second digit middle phalanx
appears chronic.
IMPRESSION: Negative radiographs of the right middle finger.

## 2020-06-17 DIAGNOSIS — E785 Hyperlipidemia, unspecified: Secondary | ICD-10-CM | POA: Diagnosis not present

## 2020-06-17 DIAGNOSIS — Z0000 Encounter for general adult medical examination without abnormal findings: Secondary | ICD-10-CM | POA: Diagnosis not present

## 2020-06-24 DIAGNOSIS — R82998 Other abnormal findings in urine: Secondary | ICD-10-CM | POA: Diagnosis not present

## 2020-06-24 DIAGNOSIS — Z0000 Encounter for general adult medical examination without abnormal findings: Secondary | ICD-10-CM | POA: Diagnosis not present

## 2020-06-24 DIAGNOSIS — Z23 Encounter for immunization: Secondary | ICD-10-CM | POA: Diagnosis not present

## 2020-07-09 DIAGNOSIS — Z01419 Encounter for gynecological examination (general) (routine) without abnormal findings: Secondary | ICD-10-CM | POA: Diagnosis not present

## 2020-07-09 DIAGNOSIS — Z6828 Body mass index (BMI) 28.0-28.9, adult: Secondary | ICD-10-CM | POA: Diagnosis not present

## 2020-07-09 DIAGNOSIS — Z1231 Encounter for screening mammogram for malignant neoplasm of breast: Secondary | ICD-10-CM | POA: Diagnosis not present

## 2020-07-22 DIAGNOSIS — B078 Other viral warts: Secondary | ICD-10-CM | POA: Diagnosis not present

## 2020-07-22 DIAGNOSIS — L438 Other lichen planus: Secondary | ICD-10-CM | POA: Diagnosis not present

## 2020-08-29 DIAGNOSIS — Z1152 Encounter for screening for COVID-19: Secondary | ICD-10-CM | POA: Diagnosis not present
